# Patient Record
Sex: Male | Born: 2000 | Race: White | Hispanic: No | Marital: Single | Smoking: Never smoker
Health system: Southern US, Community
[De-identification: ages and names within clinical notes are randomized; demographics above are authoritative.]

## PROBLEM LIST (undated history)

## (undated) DIAGNOSIS — J302 Other seasonal allergic rhinitis: Secondary | ICD-10-CM

---
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
## 2020-12-15 ENCOUNTER — Other Ambulatory Visit: Payer: Self-pay

## 2020-12-15 ENCOUNTER — Ambulatory Visit (INDEPENDENT_AMBULATORY_CARE_PROVIDER_SITE_OTHER): Payer: 59

## 2020-12-15 ENCOUNTER — Ambulatory Visit (INDEPENDENT_AMBULATORY_CARE_PROVIDER_SITE_OTHER): Payer: 59 | Admitting: Podiatry

## 2020-12-15 VITALS — BP 126/78 | HR 67 | Temp 98.0°F

## 2020-12-15 DIAGNOSIS — M722 Plantar fascial fibromatosis: Secondary | ICD-10-CM

## 2020-12-16 NOTE — Progress Notes (Signed)
Subjective:   Patient ID: Marc Edwards, male   DOB: 20 y.o.   MRN: [id number]   HPI Patient presents stating he has flatfeet and had orthotics made years ago which are no longer holding his arch up and he also needs a pair for hockey skates.  States that he is starting to get pain again and he is leaving for college.  Patient does not smoke likes to be active   Review of Systems  All other systems reviewed and are negative.      Objective:  Physical Exam Vitals and nursing note reviewed.  Constitutional:      Appearance: He is well-developed.  Pulmonary:     Effort: Pulmonary effort is normal.  Musculoskeletal:        General: Normal range of motion.  Skin:    General: Skin is warm.  Neurological:     Mental Status: He is alert.    Neurovascular status intact muscle strength was found to be adequate range of motion adequate with patient noted to have flatfoot deformity bilateral that is localized and has moderate inflammation associated with this     Assessment:  Tendinitis like condition secondary to foot structure     Plan:  H&P all conditions reviewed discussed and I recommended orthotics to try to help support the arch and explaining to him what would be required.  Patient wants to have these may and he will be seen back to discuss and at this point I did do castings and we can get them sent to him in [place] and also we can have a pair made for hockey shoes  X-rays dated today indicated moderate depression of the arch bilateral with growth plates completely closed and no other pathology noted

## 2020-12-17 ENCOUNTER — Telehealth: Payer: Self-pay | Admitting: Podiatry

## 2020-12-17 NOTE — Telephone Encounter (Signed)
Called pts mom per Dr Charlsie Merles about the athletic orthotics that were to be brought in for Dr Charlsie Merles to look at and we are making a new set but different from those. Also we need the address to send the orthotics in [place] as pt just left for [employer].   She is going to call her husband and let me know. I did explain I was leaving today at 200 and will be back Monday morning.

## 2020-12-20 DIAGNOSIS — M722 Plantar fascial fibromatosis: Secondary | ICD-10-CM

## 2023-09-18 ENCOUNTER — Ambulatory Visit: Admitting: Podiatry

## 2023-09-20 ENCOUNTER — Ambulatory Visit (INDEPENDENT_AMBULATORY_CARE_PROVIDER_SITE_OTHER): Admitting: Podiatry

## 2023-09-20 DIAGNOSIS — Q666 Other congenital valgus deformities of feet: Secondary | ICD-10-CM | POA: Diagnosis not present

## 2023-09-20 NOTE — Progress Notes (Signed)
  Subjective:  Patient ID: Marc Edwards, male    DOB: Mar 06, 2001,  MRN: [id number] HPI Chief Complaint  Patient presents with   Foot Orthotics    RM#7 Patient requesting new orthotics    23 y.o. male presents with the above complaint.   ROS: Denies fever chills nausea mobic  muscle aches pains calf pain back pain chest pain shortness of breath.  Just graduated [employer] in [employer]  with finance degree and is moving to [place] in 3 weeks start a job there.  Past Medical History:  Diagnosis Date   Seasonal allergies    Past Surgical History:  Procedure Laterality Date   TONSILLECTOMY Bilateral 2006   TURBINATE REDUCTION Bilateral 2015    Current Outpatient Medications:    albuterol (PROVENTIL HFA;VENTOLIN HFA) 108 (90 Base) MCG/ACT inhaler, Inhale into the lungs every 6 (six) hours as needed for wheezing or shortness of breath., Disp: , Rfl:    cetirizine (ZYRTEC) 10 MG tablet, Take 10 mg by mouth daily., Disp: , Rfl:    Multiple Vitamin (MULTIVITAMIN) tablet, Take 1 tablet by mouth daily., Disp: , Rfl:    meloxicam  (MOBIC ) 7.5 MG tablet, Take 1 tablet (7.5 mg total) by mouth daily. (Patient not taking: Reported on 09/20/2023), Disp: 15 tablet, Rfl: 0   methocarbamol  (ROBAXIN ) 500 MG tablet, Take 1 tablet (500 mg total) by mouth 2 (two) times daily. (Patient not taking: Reported on 09/20/2023), Disp: 20 tablet, Rfl: 0  No Known Allergies Review of Systems Objective:  There were no vitals filed for this visit.  General: Well developed, nourished, in no acute distress, alert and oriented x3   Dermatological: Skin is warm, dry and supple bilateral. Nails x 10 are well maintained; remaining integument appears unremarkable at this time. There are no open sores, no preulcerative lesions, no rash or signs of infection present.  Vascular: Dorsalis Pedis artery and Posterior Tibial artery pedal pulses are 2/4 bilateral with immedate capillary fill time. Pedal hair growth  present. No varicosities and no lower extremity edema present bilateral.   Neruologic: Grossly intact via light touch bilateral. Vibratory intact via tuning fork bilateral. Protective threshold with Semmes Wienstein monofilament intact to all pedal sites bilateral. Patellar and Achilles deep tendon reflexes 2+ bilateral. No Babinski or clonus noted bilateral.   Musculoskeletal: No gross boney pedal deformities bilateral. No pain, crepitus, or limitation noted with foot and ankle range of motion bilateral. Muscular strength 5/5 in all groups tested bilateral.  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    Radiographs:  None taken  Assessment & Plan:   Assessment: Pes planovalgus  Plan: We are scheduling him with Milana Ali Friday, June 6 at 9:00 AM to have new orthotics made.  He will then leave his old orthotics for recovering.     Braeton Wolgamott T. [place], [place]

## 2023-10-12 ENCOUNTER — Ambulatory Visit

## 2023-10-12 NOTE — Progress Notes (Signed)
   Orthotics   Patient was present and evaluated for Custom molded foot orthotics. Patient will benefit from CFO's to provide total contact to BIL MLA's helping to balance and distribute body weight more evenly across BIL feet helping to reduce plantar pressure and pain. Orthotic will also encourage FF / RF alignment  Patient was scanned today and will ship tp [place] when in  Patient will ship old orthotics to us  for refurb once new ones are received and fit confirmed     Address to ship  [address] [place], [place] [address]

## 2023-11-01 ENCOUNTER — Telehealth: Payer: Self-pay | Admitting: Podiatry

## 2023-11-01 NOTE — Telephone Encounter (Signed)
 LVM to schedule orthotic pick up
